# Patient Record
Sex: Male | Born: 1995 | Race: White | Hispanic: No | Marital: Married | Smoking: Never smoker
Health system: Southern US, Community
[De-identification: ages and names within clinical notes are randomized; demographics above are authoritative.]

---
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
## 2016-07-16 ENCOUNTER — Encounter (INDEPENDENT_AMBULATORY_CARE_PROVIDER_SITE_OTHER): Payer: Self-pay | Admitting: Orthopedic Surgery

## 2016-07-16 ENCOUNTER — Ambulatory Visit (INDEPENDENT_AMBULATORY_CARE_PROVIDER_SITE_OTHER): Payer: BLUE CROSS/BLUE SHIELD | Admitting: Orthopedic Surgery

## 2016-07-16 ENCOUNTER — Ambulatory Visit (INDEPENDENT_AMBULATORY_CARE_PROVIDER_SITE_OTHER): Payer: Self-pay

## 2016-07-16 VITALS — Ht 73.0 in | Wt 205.0 lb

## 2016-07-16 DIAGNOSIS — M79675 Pain in left toe(s): Secondary | ICD-10-CM

## 2016-07-16 NOTE — Progress Notes (Signed)
   Office Visit Note   Patient: Bob Moreno           Date of Birth: 06/20/1995           MRN: [id number] Visit Date: 07/16/2016              Requested by: No referring provider defined for this encounter. PCP: Patient, No Pcp Per  Chief Complaint  Patient presents with  . Left Foot - Pain    " turf toe"      HPI: Patient is a Bob [employer]no for [employer] he is 21 years old he states he's had no acute trauma but does catch in a squatting position with maximal dorsiflexion of the MTP joint and the IP joint of the left great toe. Patient has occasionally tried anti-inflammatories.  Assessment & Plan: Visit Diagnoses:  1. Pain of great toe, left     Plan: Recommended ibuprofen 600-800 mg 3 times a day as needed over the next several weeks. Patient was given a prescription for biotech for a carbon fiber plate for both shoes. He is to wear these for all activities of daily living Bob RuizJohn the day and will try to use these for playing his game. He states the cannot wear these while playing baseball he will not use them during baseball but otherwise use it at all times follow-up as needed.  Follow-Up Instructions: Return if symptoms worsen or fail to improve.   Ortho Exam  Patient is alert, oriented, no adenopathy, well-dressed, normal affect, normal respiratory effort. Examination patient has a normal gait. He has good pulses he has good ankle and subtalar motion. He has good motion of the MTP joint and the plantar plate is nontender to palpation no evidence of a turf toe. The sesamoids are nontender to palpation no evidence of a sesamoid stress fracture. He is point tender to palpation over the dorsum of the IP joint with dorsiflexion of the great toe. There is no redness no cellulitis.  Imaging: Xr Toe Great Left  Result Date: 07/16/2016 Three-view radiographs of the left foot some mild joint space narrowing of the MTP joint shows no proximal migration of the sesamoids there is no  evidence of a fracture of the IP joint and no joint space narrowing of the IP joint   Labs: No results found for: HGBA1C, ESRSEDRATE, CRP, LABURIC, REPTSTATUS, GRAMSTAIN, CULT, LABORGA  Orders:  Orders Placed This Encounter  Procedures  . XR Toe Great Left   No orders of the defined types were placed in this encounter.    Procedures: No procedures performed  Clinical Data: No additional findings.  ROS:  All other systems negative, except as noted in the HPI. Review of Systems  Objective: Vital Signs: Ht 6\' 1"  (1.854 m)   Wt 205 lb (93 kg)   BMI 27.05 kg/m   Specialty Comments:  No specialty comments available.  PMFS History: Patient Active Problem List   Diagnosis Date Noted  . Pain of great toe, left 07/16/2016   No past medical history on file.  No family history on file.  No past surgical history on file. Social History   Occupational History  . Not on file.   Social History Main Topics  . Smoking status: Never Smoker  . Smokeless tobacco: Never Used  . Alcohol use No  . Drug use: No  . Sexual activity: Not on file

## 2019-08-19 ENCOUNTER — Other Ambulatory Visit: Payer: Self-pay

## 2019-08-19 ENCOUNTER — Other Ambulatory Visit: Payer: Self-pay | Admitting: Nurse Practitioner

## 2019-08-19 ENCOUNTER — Ambulatory Visit
Admission: RE | Admit: 2019-08-19 | Discharge: 2019-08-19 | Disposition: A | Discharge status: Home / Self Care | Payer: No Typology Code available for payment source | Source: Ambulatory Visit | Attending: Nurse Practitioner | Admitting: Nurse Practitioner

## 2019-08-19 DIAGNOSIS — Z021 Encounter for pre-employment examination: Secondary | ICD-10-CM

## 2021-02-10 ENCOUNTER — Emergency Department (HOSPITAL_BASED_OUTPATIENT_CLINIC_OR_DEPARTMENT_OTHER): Payer: 59

## 2021-02-10 ENCOUNTER — Other Ambulatory Visit: Payer: Self-pay

## 2021-02-10 ENCOUNTER — Encounter (HOSPITAL_BASED_OUTPATIENT_CLINIC_OR_DEPARTMENT_OTHER): Payer: Self-pay | Admitting: *Deleted

## 2021-02-10 ENCOUNTER — Emergency Department (HOSPITAL_BASED_OUTPATIENT_CLINIC_OR_DEPARTMENT_OTHER)
Admission: EM | Admit: 2021-02-10 | Discharge: 2021-02-10 | Disposition: A | Discharge status: Home / Self Care | Payer: 59 | Attending: Emergency Medicine | Admitting: Emergency Medicine

## 2021-02-10 DIAGNOSIS — S01112A Laceration without foreign body of left eyelid and periocular area, initial encounter: Secondary | ICD-10-CM | POA: Diagnosis not present

## 2021-02-10 DIAGNOSIS — Y9321 Activity, ice skating: Secondary | ICD-10-CM | POA: Insufficient documentation

## 2021-02-10 DIAGNOSIS — S0101XA Laceration without foreign body of scalp, initial encounter: Secondary | ICD-10-CM

## 2021-02-10 DIAGNOSIS — S060X0A Concussion without loss of consciousness, initial encounter: Secondary | ICD-10-CM | POA: Insufficient documentation

## 2021-02-10 DIAGNOSIS — S0990XA Unspecified injury of head, initial encounter: Secondary | ICD-10-CM

## 2021-02-10 MED ORDER — LIDOCAINE HCL (PF) 1 % IJ SOLN
10.0000 mL | Freq: Once | INTRAMUSCULAR | Status: AC
  Administered 2021-02-10: 10 mL
  Filled 2021-02-10: qty 10

## 2021-02-10 MED ORDER — ACETAMINOPHEN 500 MG PO TABS
1000.0000 mg | ORAL_TABLET | Freq: Once | ORAL | Status: AC
  Administered 2021-02-10: 1000 mg via ORAL
  Filled 2021-02-10: qty 2

## 2021-02-10 NOTE — ED Notes (Signed)
Per family, pt c/o HA and was crying d/t pain; pt refused Tylenol when offered

## 2021-02-10 NOTE — ED Provider Notes (Signed)
Emergency Department Provider Note   I have reviewed the triage vital signs and the nursing notes.   HISTORY  Chief Complaint Head Injury   HPI Bob Moreno is a 25 y.o. male presents to the emergency department for evaluation after a fall.  Patient was ice-skating today with family and friends when he fell forward striking his head and face on the ice.  No no loss of consciousness but wife, at bedside, states that he was confused afterwards and has seemed intermittently agitated, which is unlike him.  He has been overall improving since arrival to the emergency department but she notes that he continues to occasionally be somewhat more agitated.  Patient denies any fall on outstretched hands.  No syncope.  No leg or back pain.  No unilateral numbness or weakness.   History reviewed. No pertinent past medical history.  Patient Active Problem List   Diagnosis Date Noted   Pain of great toe, left 07/16/2016    History reviewed. No pertinent surgical history.  Allergies Patient has no known allergies.  No family history on file.  Social History Social History   Tobacco Use   Smoking status: Never   Smokeless tobacco: Never  Vaping Use   Vaping Use: Never used  Substance Use Topics   Alcohol use: Yes    Comment: occasional   Drug use: No    Review of Systems  Constitutional: No fever/chills Eyes: No visual changes. ENT: No sore throat. Cardiovascular: Denies chest pain. Respiratory: Denies shortness of breath. Gastrointestinal: No abdominal pain.  No nausea, no vomiting.  No diarrhea.  No constipation. Genitourinary: Negative for dysuria. Musculoskeletal: Negative for back pain. No arm pain.  Skin: Right eyebrow laceration.  Neurological: Negative for focal weakness or numbness. Positive HA.   10-point ROS otherwise negative.  ____________________________________________   PHYSICAL EXAM:  VITAL SIGNS: ED Triage Vitals  Enc Vitals Group     BP  02/10/21 2106 (!) 137/96     Pulse Rate 02/10/21 2106 (!) 130     Resp 02/10/21 2106 20     Temp 02/10/21 2106 98.7 F (37.1 C)     Temp Source 02/10/21 2106 Oral     SpO2 02/10/21 2106 96 %     Weight --      Height 02/10/21 2107 6\' 2"  (1.88 m)   Constitutional: Alert and oriented. Well appearing and in no acute distress. Eyes: Conjunctivae are normal. PERRL. EOMI. Head: Hematoma to the left forehead with left lateral eyebrow laceration. Nose: No congestion/rhinnorhea. Mouth/Throat: Mucous membranes are moist.  Oropharynx non-erythematous. Neck: No stridor.  No cervical spine tenderness to palpation. Cardiovascular: Normal rate, regular rhythm. Good peripheral circulation. Grossly normal heart sounds.   Respiratory: Normal respiratory effort.  No retractions. Lungs CTAB. Gastrointestinal: No distention.  Musculoskeletal: No lower extremity tenderness nor edema. No gross deformities of extremities. Neurologic:  Normal speech and language. No gross focal neurologic deficits are appreciated.  Skin:  Skin is warm and dry. Scalp hematoma with 3 cm laceration to the lateral left eyebrow.   ____________________________________________  RADIOLOGY  CT HEAD WO CONTRAST ( )  Result Date: 02/10/2021 CLINICAL DATA:  Head trauma, moderate-severe. Fall ice skating. Hit head. EXAM: CT HEAD WITHOUT CONTRAST TECHNIQUE: Contiguous axial images were obtained from the base of the skull through the vertex without intravenous contrast. COMPARISON:  None. FINDINGS: Brain: No acute intracranial abnormality. Specifically, no hemorrhage, hydrocephalus, mass lesion, acute infarction, or significant intracranial injury. Vascular: No hyperdense vessel or unexpected  calcification. Skull: No acute calvarial abnormality. Sinuses/Orbits: No acute findings. Other: None IMPRESSION: No acute intracranial abnormality. Electronically Signed   By: Charlett Nose M.D.   On: 02/10/2021 22:15   CT Cervical Spine Wo  Contrast  Result Date: 02/10/2021 CLINICAL DATA:  Fall ice skating. Hit head. Neck trauma, midline tenderness (Age 49-64y) EXAM: CT CERVICAL SPINE WITHOUT CONTRAST TECHNIQUE: Multidetector CT imaging of the cervical spine was performed without intravenous contrast. Multiplanar CT image reconstructions were also generated. COMPARISON:  None. FINDINGS: Alignment: Normal. Skull base and vertebrae: No acute fracture. No primary bone lesion or focal pathologic process. Soft tissues and spinal canal: No prevertebral fluid or swelling. No visible canal hematoma. Disc levels:  Normal Upper chest: Negative Other: None IMPRESSION: Normal study. Electronically Signed   By: Charlett Nose M.D.   On: 02/10/2021 22:16   CT Maxillofacial Wo Contrast  Result Date: 02/10/2021 CLINICAL DATA:  Facial trauma, blunt.  Fall ice skating.  Hit head. EXAM: CT MAXILLOFACIAL WITHOUT CONTRAST TECHNIQUE: Multidetector CT imaging of the maxillofacial structures was performed. Multiplanar CT image reconstructions were also generated. COMPARISON:  None. FINDINGS: Osseous: No fracture or mandibular dislocation. No destructive process. Orbits: Negative. No traumatic or inflammatory finding. Sinuses: Postoperative changes in the left paranasal sinuses. Mucosal thickening throughout the left paranasal sinuses. No air-fluid levels. Soft tissues: Negative Limited intracranial: See head CT report IMPRESSION: No facial or orbital fracture. Electronically Signed   By: Charlett Nose M.D.   On: 02/10/2021 22:18    ____________________________________________   PROCEDURES  Procedure(s) performed:   Marland KitchenMarland KitchenLaceration Repair  Date/Time: 02/11/2021 1:14 PM Performed by: Maia Plan, MD Authorized by: Maia Plan, MD   Consent:    Consent obtained:  Verbal   Consent given by:  Patient   Risks, benefits, and alternatives were discussed: yes     Risks discussed:  Infection, need for additional repair, poor wound healing, poor cosmetic  result, pain and retained foreign body   Alternatives discussed:  No treatment Universal protocol:    Patient identity confirmed:  Verbally with patient Anesthesia:    Anesthesia method:  Local infiltration   Local anesthetic:  Lidocaine 1% w/o epi Laceration details:    Location:  Face   Face location:  L eyebrow   Length (cm):  3 Pre-procedure details:    Preparation:  Patient was prepped and draped in usual sterile fashion and imaging obtained to evaluate for foreign bodies Exploration:    Limited defect created (wound extended): no     Hemostasis achieved with:  Direct pressure   Imaging obtained comment:  CT head/face   Imaging outcome: foreign body not noted     Wound exploration: wound explored through full range of motion and entire depth of wound visualized     Wound extent: no foreign bodies/material noted, no muscle damage noted, no nerve damage noted, no underlying fracture noted and no vascular damage noted     Contaminated: no   Treatment:    Area cleansed with:  Povidone-iodine   Amount of cleaning:  Standard   Visualized foreign bodies/material removed: no   Skin repair:    Repair method:  Sutures   Suture size:  5-0   Suture material:  Prolene   Suture technique:  Simple interrupted   Number of sutures:  4 Approximation:    Approximation:  Close Repair type:    Repair type:  Simple Post-procedure details:    Dressing:  Open (no dressing)   Procedure completion:  Tolerated well, no immediate complications  ____________________________________________   INITIAL IMPRESSION / ASSESSMENT AND PLAN / ED COURSE  Pertinent labs & imaging results that were available during my care of the patient were reviewed by me and considered in my medical decision making (see chart for details).   Patient presents to the emergency department for evaluation after a fall while skating.  He has forehead hematoma and laceration to the lateral left eyebrow area that would likely  benefit from stitches.  No signs of entrapment.  He is somewhat agitated and emotionally labile here.  Suspect concussion clinically but will need CT imaging of the head along with face and cervical spine given his history of being somewhat altered on scene to rule out traumatic subarachnoid or subdural although lower suspicion for this clinically.  CT imaging reviewed and reassuring. Patient feeling well. Will refer to Neuro for concussion evaluation. Will return in 1 week for suture removal.  ____________________________________________  FINAL CLINICAL IMPRESSION(S) / ED DIAGNOSES  Final diagnoses:  Injury of head, initial encounter  Laceration of scalp, initial encounter  Concussion without loss of consciousness, initial encounter     MEDICATIONS GIVEN DURING THIS VISIT:  Medications  lidocaine (PF) (XYLOCAINE) 1 % injection 10 mL (10 mLs Infiltration Given by Other 02/10/21 2147)  acetaminophen (TYLENOL) tablet 1,000 mg (1,000 mg Oral Given 02/10/21 2223)    Note:  This document was prepared using Dragon voice recognition software and may include unintentional dictation errors.  Alona Bene, MD, [facility]    Jibri Schriefer, Arlyss Repress, MD 02/11/21 [phone]

## 2021-02-10 NOTE — ED Notes (Signed)
ED Provider at bedside. 

## 2021-02-10 NOTE — ED Notes (Signed)
Pts wife came out to desk stating that pts pupils "are not dilating equally". EDP notified.

## 2021-02-10 NOTE — ED Triage Notes (Addendum)
Per pt's wife at bedside, pt went ice skating tonight and fell when he stepped on the ice hitting his head first. States pt has been confused since the fall. Pt alert, oriented to self and place but confused to situation (when explained doctor would be in to evaluate him pt questioned why) and time (stated today was Thursday). Pt's wife states pt had 2 drinks over a 2 hour period. Lac present over left eye with swelling noted. PERRLA. Denies pain

## 2021-02-10 NOTE — ED Notes (Signed)
Pt became somewhat agitated when this RN assessing him; wife and brother were at bedside; wife sts this behavior is not typical for pt

## 2021-02-10 NOTE — Discharge Instructions (Signed)

## 2021-02-10 NOTE — ED Notes (Signed)
Pt refused vital sign recheck. Removed bp cuff prior to recheck. Heart rate improved. No signs of distress.

## 2021-02-20 ENCOUNTER — Ambulatory Visit (INDEPENDENT_AMBULATORY_CARE_PROVIDER_SITE_OTHER): Payer: 59 | Admitting: Neurology

## 2021-02-20 ENCOUNTER — Encounter: Payer: Self-pay | Admitting: Neurology

## 2021-02-20 ENCOUNTER — Other Ambulatory Visit: Payer: Self-pay

## 2021-02-20 ENCOUNTER — Telehealth: Payer: Self-pay | Admitting: Neurology

## 2021-02-20 VITALS — BP 143/94 | HR 88 | Ht 74.0 in | Wt 243.0 lb

## 2021-02-20 DIAGNOSIS — S060X0A Concussion without loss of consciousness, initial encounter: Secondary | ICD-10-CM | POA: Insufficient documentation

## 2021-02-20 NOTE — Telephone Encounter (Signed)
Pt called states he forgot to get a return to work note when he left. 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

## 2021-02-20 NOTE — Telephone Encounter (Signed)
No restrictions, patient will return to work on Friday 02-23-2021. Melvyn Novas, MD

## 2021-02-20 NOTE — Progress Notes (Signed)
SLEEP MEDICINE CLINIC    Provider:  Melvyn Novas, MD  Primary Care Physician:     Referring Provider: Maia Plan, Md [address] [place],  [place] [address]          Chief Complaint according to patient   Patient presents with:     New Patient (Initial Visit)           HISTORY OF PRESENT ILLNESS:  Bob Moreno is a 25 year -old Caucasian male patient seen here upon referral on 02/20/2021 from ED,  for a .  Chief concern according to patient : " I fell ice skating and hit my head- last Saturday , 02-10-2021, at 8.5 Pm".  He suffered a left eyebrow abrasion, and open wound needing stitches. He fell face first after tripping told his RN wife, he didn't vomit- but has anterograde amnesia. .  No insomnia, no double or blurred vision, headaches for 4 days super tired until Wednesday- feeling normal.No vertigo.     I have the pleasure of seeing Bob Moreno today, a right-handed Caucasian male with a possible concussion.  Social history:  Patient is working as a IT sales professional,  and lives in a household with spouse Bob Moreno, Bob Moreno) , no children.  Tobacco [id number] .  ETOH use rare , college.  Caffeine intake in form of Coffee( 1 cup) Hobbies : no regular sports.     Review of Systems: Out of a complete 14 system review, the patient complains of only the following symptoms, and all other reviewed systems are negative.:  No lasting systems.    Social History   Socioeconomic History   Marital status: Married    Spouse name: Bob Moreno   Number of children: Not on file   Years of education: Not on file   Highest education level: Bachelor's degree (e.g., BA, AB, BS)  Occupational History   Occupation: firefighter  Tobacco Use   Smoking status: Never   Smokeless tobacco: Never  Vaping Use   Vaping Use: Never used  Substance and Sexual Activity   Alcohol use: Yes    Comment: occasional   Drug use: No   Sexual activity: Not on file  Other Topics Concern   Not on file   Social History Narrative   Lives at home with wife   Right handed   Caffeine: a cup of coffee a day   Social Determinants of Health   Financial Resource Strain: Not on file  Food Insecurity: Not on file  Transportation Needs: Not on file  Physical Activity: Not on file  Stress: Not on file  Social Connections: Not on file    Family History  Problem Relation Age of Onset   Cancer - Colon Paternal Grandfather    Physical exam:  Today's Vitals   02/20/21 1011  BP: (!) 143/94  Pulse: 88  Weight: 243 lb (110.2 kg)  Height: 6\' 2"  (1.88 m)   Body mass index is 31.2 kg/m.   Wt Readings from Last 3 Encounters:  02/20/21 243 lb (110.2 kg)  07/16/16 205 lb (93 kg)     Ht Readings from Last 3 Encounters:  02/20/21 6\' 2"  (1.88 m)  02/10/21 6\' 2"  (1.88 m)  07/16/16 6\' 1"  (1.854 m)      General: The patient is awake, alert and appears not in acute distress. The patient is well groomed. Head: Normocephalic, atraumatic. Neck is supple. Mallampati 2,  Dental status: intact  Cardiovascular:  Regular rate and  cardiac rhythm by pulse,  without distended neck veins. Respiratory: Lungs are clear to auscultation.  Skin:  Without evidence of ankle edema, or rash. Trunk: The patient's posture is erect.   Neurologic exam : The patient is awake and alert, oriented to place and time.   Memory subjective described as intact.  Attention span & concentration ability appears normal.  Speech is fluent,  without  dysarthria, dysphonia or aphasia.  Mood and affect are appropriate.   Cranial nerves: no loss of smell or taste reported  Pupils are equal and briskly reactive to light. Funduscopic exam unremarkable. .  Extraocular movements in vertical and horizontal planes were intact and without nystagmus. No Diplopia. Visual fields by finger perimetry are intact. Hearing was intact to soft voice and finger rubbing.  Facial sensation intact to fine touch.  Facial motor strength is symmetric  and tongue and uvula move midline.  Neck ROM : rotation, tilt and flexion extension were normal for age and shoulder shrug was symmetrical.    Motor exam:  Symmetric bulk, tone and ROM.   Normal tone without cog wheeling, symmetric grip strength .   Sensory:  Fine touch, pinprick and vibration were tested  and  normal.  Proprioception tested in the upper extremities was normal.   Coordination: Rapid alternating movements in the fingers/hands were of normal speed.  The Finger-to-nose maneuver was intact without evidence of ataxia, dysmetria or tremor.   Gait and station: Patient could rise unassisted from a seated position, walked without assistive device.  Stance is of normal width/ base and the patient turned with 3 steps.  Toe and heel walk were deferred.  Deep tendon reflexes: in the  upper and lower extremities are symmetric and intact.  Babinski response was deferred .      CT and ED evaluation:  His head CT from 02/10/2021 was performed at 10:15 PM cervical spine was also evaluated by CT all this was normal skull base and vertebrae showed no fracture no dislocation no primary bone lesion soft tissue and spine and spinal canal showed no fluid accumulation or swelling of any kind.  No visible hematoma to.  Blunt trauma to the face.  Even his orbits are remarkably normal.  Maxillofacial facial CT showed no traumatic or inflammatory finding coincidentally there is mucosal thickening throughout the left paranasal sinuses noted.  This must have been there before the accident.  Sutures were removed without difficulties I do not think he needs an EEG I do not think he needs an MRI at this time I will release him to go back to work on Friday.  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   After spending a total time of  30  minutes face to face and additional time for physical and neurologic examination, review of laboratory studies,   personal review of imaging studies, reports and results of other testing and review of referral information / records as far as provided in visit, I have established the following assessments:  1) the patient undoubtedly suffered a concussion based on his initial symptoms which have all resolved.  He did remember going to the ice-skating ring of course but he had anterograde amnesia for the moment when he stepped on the ice and when he fell.  This is typical.  There is no reported loss of consciousness.   He appeared confused to his wife.  But he was not unresponsive.  My Plan is to proceed with:  1) No follow up needed.    I would like to thank Patient, No Pcp Per (Inactive) and Long, Arlyss Repress, Md [address] [place],  [place] [address] for allowing me to meet with and to take care of this pleasant patient.   In short, Bob Moreno is presenting with a concussion, resolved. ,Prn follow up.  CC: I will share my notes with PCP. Marland Kitchen  Electronically signed by: Melvyn Novas, MD 02/20/2021 10:17 AM  [facility] Board certified by The [employer] of Sleep Medicine and Diplomate of the [employer] of Sleep Medicine. Board certified In Neurology through the ABPN, Fellow of the [employer] of Neurology. Medical Director of [facility].

## 2021-02-20 NOTE — Patient Instructions (Signed)
Concussion, Adult ?A concussion is a brain injury from a hard, direct hit (trauma) to your head or body. This direct hit causes your brain to quickly shake back and forth inside your skull. A concussion may also be called a mild traumatic brain injury (TBI). Healing from this injury can take time. ?What are the causes? ?This condition is caused by: ?A direct hit to your head, such as: ?Running into a player during a game. ?Being hit in a fight. ?Hitting your head on a hard surface. ?A quick and sudden movement of the head or neck, such as in a car crash. ?What are the signs or symptoms? ?The signs of a concussion can be hard to notice. They may be missed by you, family members, and doctors. You may look fine on the outside but may not act or feel normal. ?Physical symptoms ?Headaches. ?Being dizzy. ?Problems with body balance. ?Being sensitive to light or noise. ?Vomiting or feeling like you may vomit. ?Being tired. ?Problems seeing or hearing. ?Not sleeping or eating as you used to. ?Seizure. ?Mental and emotional symptoms ?Feeling grouchy (irritable). ?Having mood changes. ?Problems remembering things. ?Trouble focusing your mind (concentrating), organizing, or making decisions. ?Being slow to think, act, react, speak, or read. ?Feeling worried or nervous (anxious). ?Feeling sad (depressed). ?How is this treated? ?This condition may be treated by: ?Stopping sports or activity if you are injured. If you hit your head or have signs of concussion: ?Do not return to sports or activities the same day. ?Get checked by a doctor before you return to your activities. ?Resting your body and your mind. ?Being watched carefully, often at home. ?Medicines to help with symptoms such as: ?Headaches. ?Feeling like you may vomit. ?Problems with sleep. ?Avoiding alcohol and drugs. ?Being asked to go to a concussion clinic or a place to help you recover (rehabilitation center). ?Recovery from a concussion can take time. Return to  activities only: ?When you are fully healed. ?When your doctor says it is safe. ?Avoid taking strong pain medicines (opioids) for a concussion. ?Follow these instructions at home: ?Activity ?Limit activities that need a lot of thought or focus, such as: ?Homework or work for your job. ?Watching TV. ?Using the computer or phone. ?Playing memory games and puzzles. ?Rest. Rest helps your brain heal. Make sure you: ?Get plenty of sleep. Most adults should get 7-9 hours of sleep each night. ?Rest during the day. Take naps or breaks when you feel tired. ?Avoid activity like exercise until your doctor says its safe. Stop any activity that makes symptoms worse. ?Do not do activities that could cause a second concussion, such as riding a bike or playing sports. ?Ask your doctor when you can return to your normal activities, such as school, work, sports, and driving. Your ability to react may be slower. Do not do these activities if you are dizzy. ?General instructions ? ?Take over-the-counter and prescription medicines only as told by your doctor. ?Do not drink alcohol until your doctor says you can. ?Watch your symptoms and tell other people to do the same. Other problems can occur after a concussion. Older adults have a higher risk of serious problems. ?Tell your work manager, teachers, school nurse, school counselor, coach, or athletic trainer about your injury and symptoms. Tell them about what you can or cannot do. ?Keep all follow-up visits as told by your doctor. This is important. ?How is this prevented? ?It is very important that you do not get another brain injury. In rare   cases, another injury can cause brain damage that will not go away, brain swelling, or death. The risk of this is greatest in the first 7-10 days after a head injury. To avoid injuries: ?Stop activities that could lead to a second concussion, such as contact sports, until your doctor says it is okay. ?When you return to sports or activities: ?Do  not crash into other players. This is how most concussions happen. ?Follow the rules. ?Respect other players. Do not engage in violent behavior while playing. ?Get regular exercise. Do strength and balance training. ?Wear a helmet that fits you well during sports, biking, or other activities. ?Helmets can help protect you from serious skull and brain injuries, but they do not protect you from a concussion. Even when wearing a helmet, you should avoid being hit in the head. ?Contact a doctor if: ?Your symptoms do not get better. ?You have new symptoms. ?You have another injury. ?Get help right away if: ?You have bad headaches or your headaches get worse. ?You feel weak or numb in any part of your body. ?You feel mixed up (confused). ?Your balance gets worse. ?You vomit often. ?You feel more sleepy than normal. ?You cannot speak well, or have slurred speech. ?You have a seizure. ?Others have trouble waking you up. ?You have changes in how you act. ?You have changes in how you see (vision). ?You pass out (lose consciousness). ?These symptoms may be an emergency. Do not wait to see if the symptoms will go away. Get medical help right away. Call your local emergency services (911 in the [place] Do not drive yourself to the hospital. ?Summary ?A concussion is a brain injury from a hard, direct hit (trauma) to your head or body. ?This condition is treated with rest and careful watching of symptoms. ?Ask your doctor when you can return to your normal activities, such as school, work, or driving. ?Get help right away if you have a very bad headache, feel weak in any part of your body, have a seizure, have changes in how you act or see, or if you are mixed up or more sleepy than normal. ?This information is not intended to replace advice given to you by your health care provider. Make sure you discuss any questions you have with your health care provider. ?Document Revised: 05/04/2020 Document Reviewed: 05/04/2020 ?Elsevier  Patient Education ? 2022 [employer] ? ?

## 2021-02-20 NOTE — Telephone Encounter (Signed)
Dr. Vickey Huger- you saw pt today. Ok to provide return to work letter? Any restrictions?

## 2021-02-21 ENCOUNTER — Encounter: Payer: Self-pay | Admitting: *Deleted

## 2021-02-21 NOTE — Telephone Encounter (Signed)
LVM for pt letting him know letter ready for pick up. Provided office hours for this week. Asked him to call back if any other questions/concerns.

## 2022-12-06 ENCOUNTER — Other Ambulatory Visit: Payer: Self-pay

## 2022-12-06 ENCOUNTER — Encounter (HOSPITAL_BASED_OUTPATIENT_CLINIC_OR_DEPARTMENT_OTHER): Payer: Self-pay

## 2022-12-06 ENCOUNTER — Emergency Department (HOSPITAL_BASED_OUTPATIENT_CLINIC_OR_DEPARTMENT_OTHER): Payer: No Typology Code available for payment source

## 2022-12-06 ENCOUNTER — Emergency Department (HOSPITAL_BASED_OUTPATIENT_CLINIC_OR_DEPARTMENT_OTHER)
Admission: EM | Admit: 2022-12-06 | Discharge: 2022-12-06 | Disposition: A | Discharge status: Home / Self Care | Payer: Worker's Compensation | Attending: Emergency Medicine | Admitting: Emergency Medicine

## 2022-12-06 DIAGNOSIS — S82831A Other fracture of upper and lower end of right fibula, initial encounter for closed fracture: Secondary | ICD-10-CM | POA: Insufficient documentation

## 2022-12-06 DIAGNOSIS — S99911A Unspecified injury of right ankle, initial encounter: Secondary | ICD-10-CM | POA: Diagnosis present

## 2022-12-06 DIAGNOSIS — X501XXA Overexertion from prolonged static or awkward postures, initial encounter: Secondary | ICD-10-CM | POA: Diagnosis not present

## 2022-12-06 DIAGNOSIS — Y990 Civilian activity done for income or pay: Secondary | ICD-10-CM | POA: Insufficient documentation

## 2022-12-06 MED ORDER — CELECOXIB 200 MG PO CAPS: 200.0000 mg | ORAL_CAPSULE | Freq: Two times a day (BID) | ORAL | 0 refills | Status: AC

## 2022-12-06 NOTE — Discharge Instructions (Signed)
1) you must remain nonweightbearing on the ankle until you are cleared by orthopedics. Please call first thing Monday morning to schedule an appointment at [facility].  (Dr. Marlene Bast office)) Contact a health care provider if: You have pain or swelling that gets worse. Your pain or swelling does not get better with rest or medicine. Your cast gets damaged. Get help right away if: You develop new pain or swelling. Your skin or toes below the injured ankle: Turn blue or gray. Feel cold. Lose feeling. Have less feeling than normal when something touches them.

## 2022-12-06 NOTE — ED Provider Notes (Signed)
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  Patient reports that he hit a slick spot on the pavement and his right foot rolled backward and he inverted the ankle and felt pain and a snap on the distal part of his leg.  He is unable to bear weight.  He was brought in for further evaluation.  He denies numbness or tingling.   Ankle Pain      Home Medications Prior to Admission medications   Not on File      Allergies    Patient has no known allergies.    Review of Systems   Review of Systems  Physical Exam Updated Vital Signs BP (!) 156/96   Pulse 77   Temp 98.5 F (36.9 C) (Oral)   Resp 16   SpO2 99%  Physical Exam Vitals and nursing note reviewed.  Constitutional:      General: He is not in acute distress.    Appearance: He is well-developed. He is not diaphoretic.  HENT:     Head: Normocephalic and atraumatic.  Eyes:     General: No scleral icterus.    Conjunctiva/sclera: Conjunctivae normal.  Cardiovascular:     Rate and Rhythm: Normal rate and regular rhythm.     Heart sounds: Normal heart sounds.  Pulmonary:     Effort: Pulmonary effort is normal. No respiratory distress.     Breath sounds: Normal breath sounds.  Abdominal:     Palpations: Abdomen is soft.     Tenderness: There is no abdominal tenderness.  Musculoskeletal:     Cervical back: Normal range of motion and neck supple.     Comments: Normal sensation and capillary refill, DP and PT pulse 2+.  Range of motion limited due to pain in the ankle.  Significant tenderness and swelling noted over the lateral malleolus.  No calf tenderness.  Skin:    General: Skin is warm and dry.   Neurological:     Mental Status: He is alert.  Psychiatric:        Behavior: Behavior normal.     ED Results / Procedures / Treatments   Labs (all labs ordered are listed, but only abnormal results are displayed) Labs Reviewed - No data to display  EKG None  Radiology No results found.  Procedures .Splint Application  Date/Time: 12/07/2022 12:44 AM  Performed by: Arthor Captain, PA-C Authorized by: Arthor Captain, PA-C   Consent:    Consent obtained:  Verbal   Consent given by:  Patient   Risks discussed:  Discoloration, numbness and swelling   Alternatives discussed:  No treatment Universal protocol:    Patient identity confirmed:  Arm band Pre-procedure details:    Distal neurologic exam:  Normal   Distal perfusion: distal pulses strong   Procedure details:    Location:  Ankle   Ankle location:  L ankle   Splint type:  Short leg   Supplies:  Fiberglass, elastic bandage and cotton padding Post-procedure details:    Distal neurologic exam:  Unchanged   Distal perfusion: distal pulses strong     Procedure completion:  Tolerated well, no immediate complications  Medications Ordered in ED Medications - No data to display  ED Course/ Medical Decision Making/ A&P Clinical Course as of 12/06/22 1910  Fri Dec 06, 2022  4098 Case discussed with Dr. Ophelia Charter. Recommends NWB, short leg splint and office follow up . [AH]    Clinical Course User Index [AH] Arthor Captain, PA-C                                 Medical Decision Making 27 year old male with mechanical injury to the left ankle.  Patient has an oblique fracture of the distal fibula and an acute transverse fracture of the distal physeal region of the fibula.  Case discussed as above with Dr. Ophelia Charter.  Patient placed in short leg splint.  He is to remain nonweightbearing on crutches until he follows up with orthopedics in the outpatient setting.  Noted to have elevated blood pressure here in the setting of  trauma.  He is advised to follow-up with that in the outpatient setting.  Pain well-controlled.  Discussed outpatient follow-up and return precautions.  Amount and/or Complexity of Data Reviewed Radiology: ordered.    Details: I visualized and interpreted a right ankle x-ray which shows a distal fibular fracture and 2 sites.  Ankle mortise appears to be well aligned.  No obvious displacement of the joint.  Risk Prescription drug management.   27 year old male with right ankle injury. Patient will need outpatient follow-up with orthopedics.  Will give referral to Dr. Ophelia Charter for follow-up.  Discussed return precautions.        Final Clinical Impression(s) / ED Diagnoses Final diagnoses:  None    Rx / DC Orders ED Discharge Orders     None         Arthor Captain, PA-C 12/07/22 0045    Royanne Foots, DO 12/17/22 [phone]

## 2022-12-06 NOTE — ED Triage Notes (Signed)
Pt c/o R ankle injury after playing pickleball. +ROM, +Cap refill  Ibuprofen PTA, approx ago

## 2022-12-06 NOTE — ED Notes (Signed)
Reviewed AVS with patient, patient expressed understanding of directions, denies further questions at this time. 

## 2023-01-25 IMAGING — CT CT HEAD W/O CM
3 series · 16 of 47 positions shown, 19 images · non-contrast
Comparison: None.

CLINICAL DATA: Head trauma, moderate-severe. Fall ice skating. Hit
head.

EXAM:
CT HEAD WITHOUT CONTRAST
TECHNIQUE: Contiguous axial images were obtained from the base of the skull
through the vertex without intravenous contrast.

[Series 3: head 5.0 h30s · axial · 0.49mm/px · z∈[-204,-49]mm · 10 of 37 slices shown, 13 images]
[im 3/37  brain]
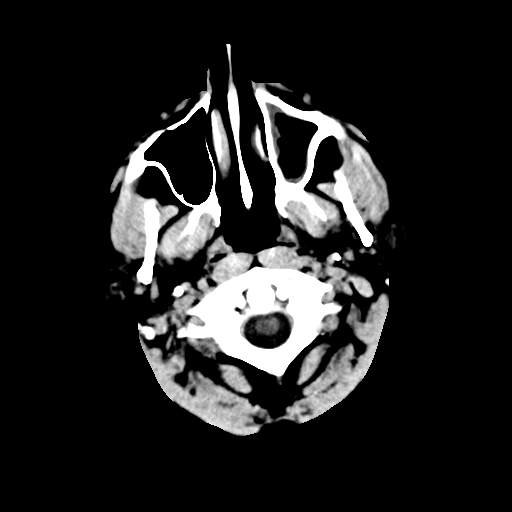
[im 3/37  bone]
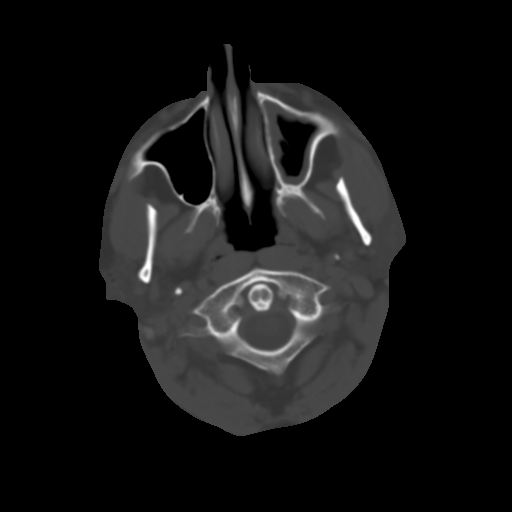
[im 7/37  brain]
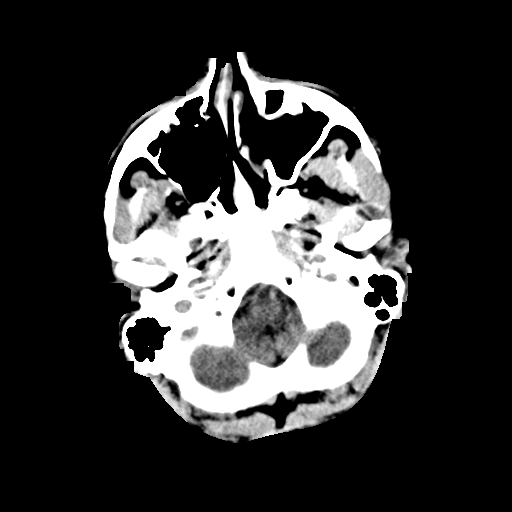
[im 10/37  brain]
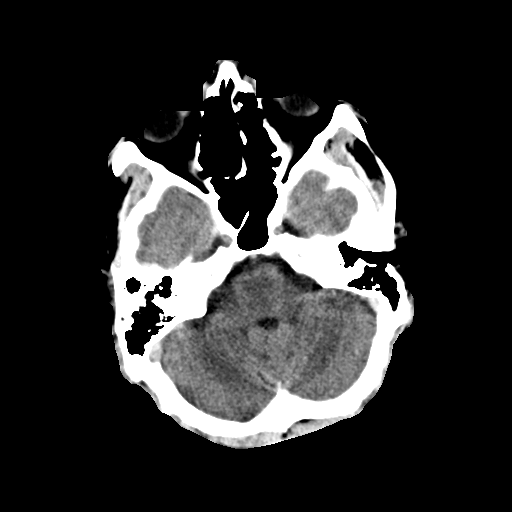
[im 13/37  brain]
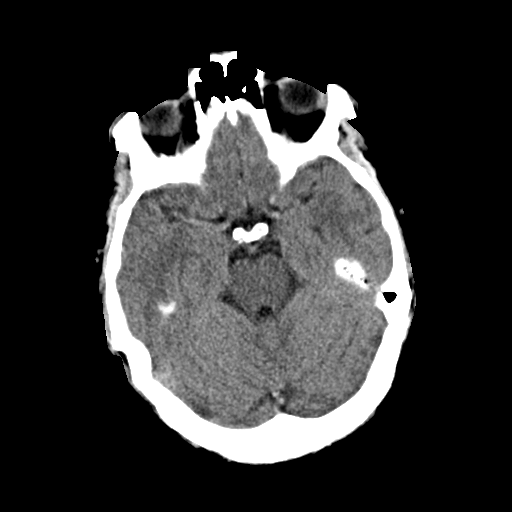
[im 17/37  brain]
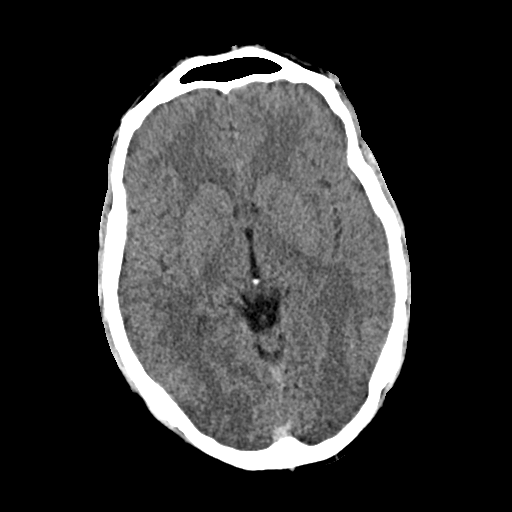
[im 17/37  bone]
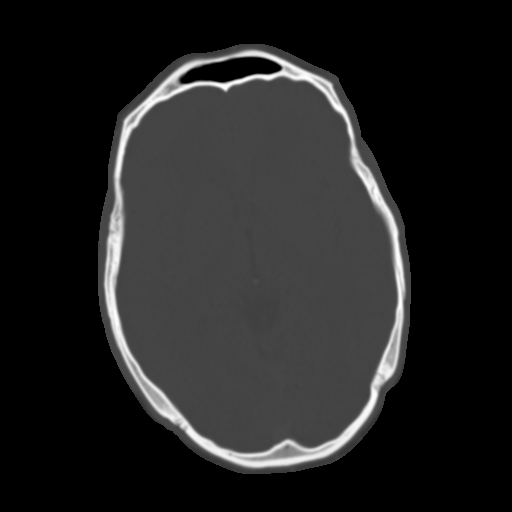
[im 20/37  brain]
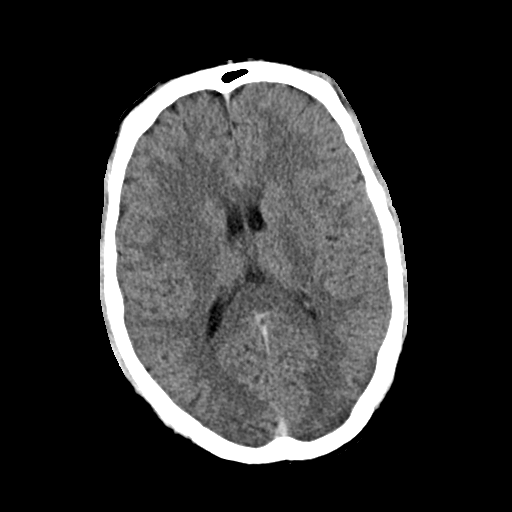
[im 24/37  brain]
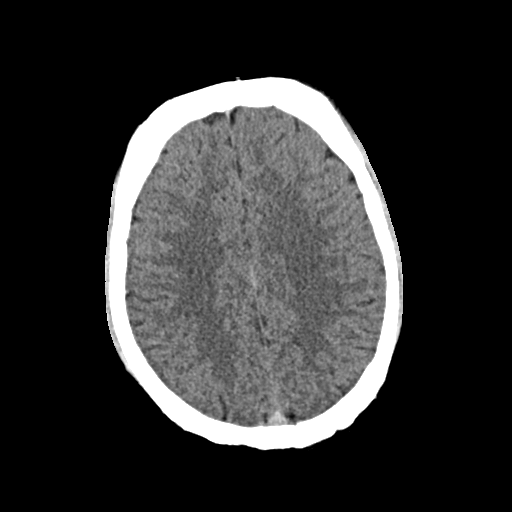
[im 28/37  brain]
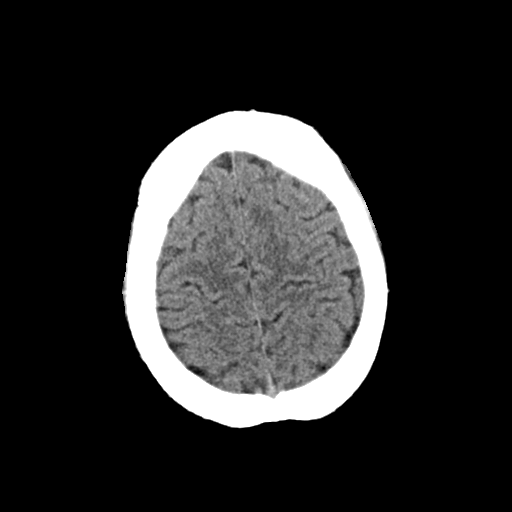
[im 30/37  brain]
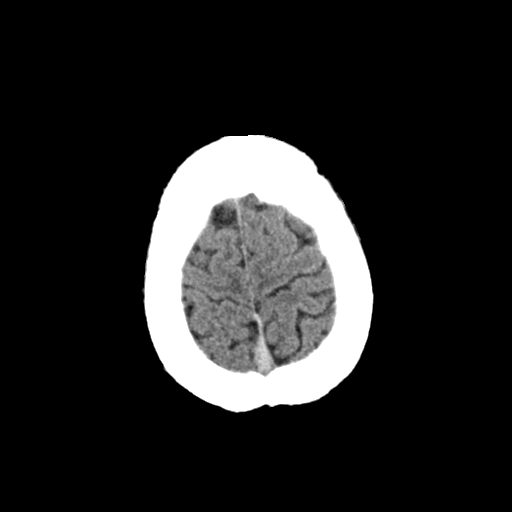
[im 30/37  bone]
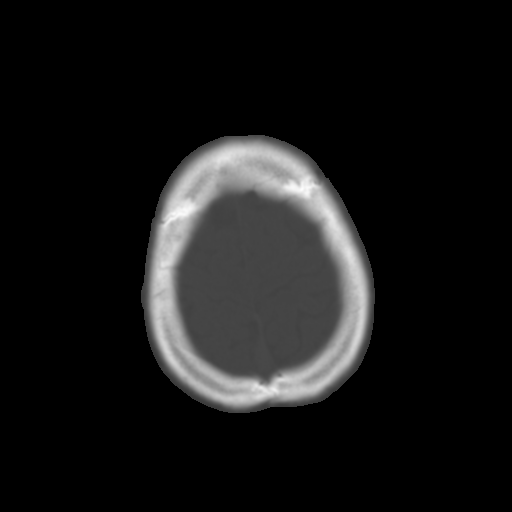
[im 34/37  brain]
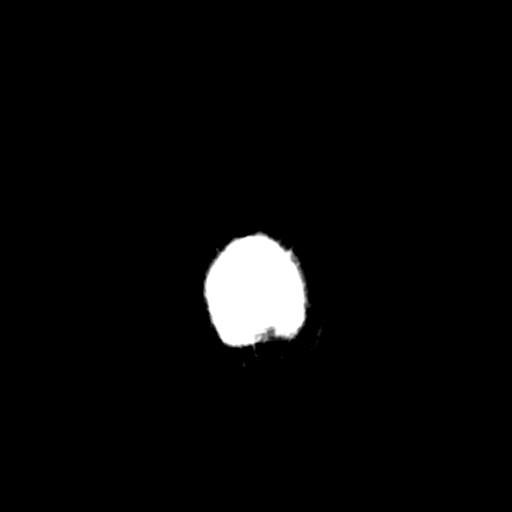

[Series 5: head 3.0 mpr cor · coronal · 0.36mm/px · 3 of 80 slices shown]
[im 27/80  brain]
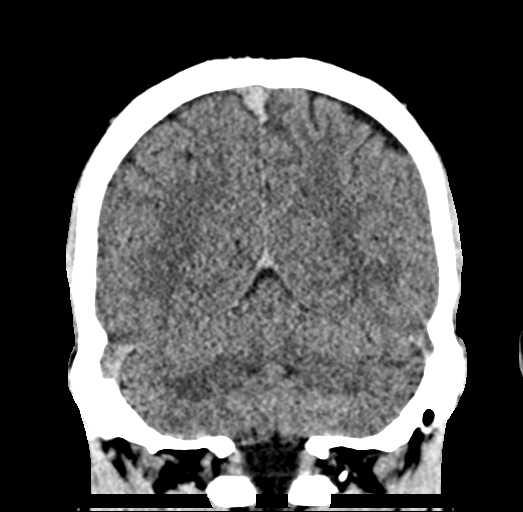
[im 36/80  brain]
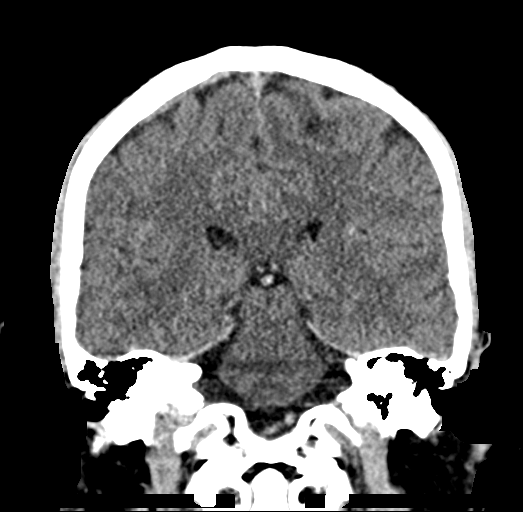
[im 44/80  brain]
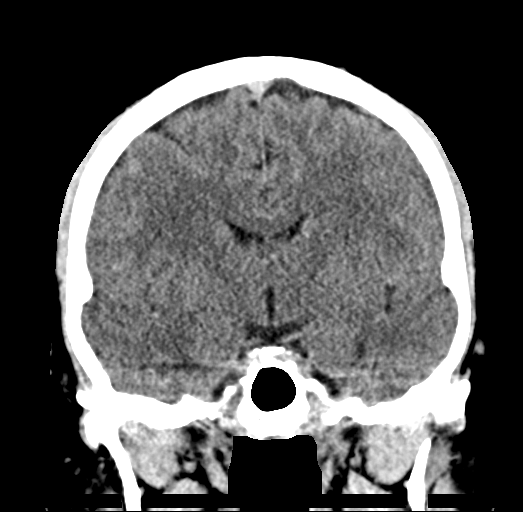

[Series 6: head 3.0 mpr sag · sagittal · 0.36mm/px · 3 of 67 slices shown]
[im 23/67  brain]
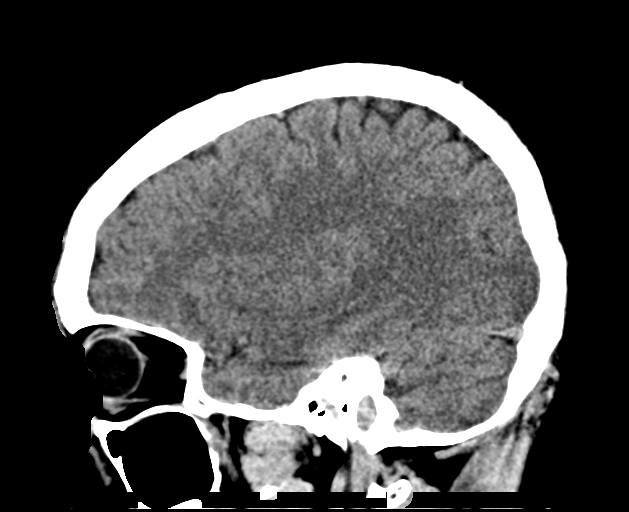
[im 34/67  brain]
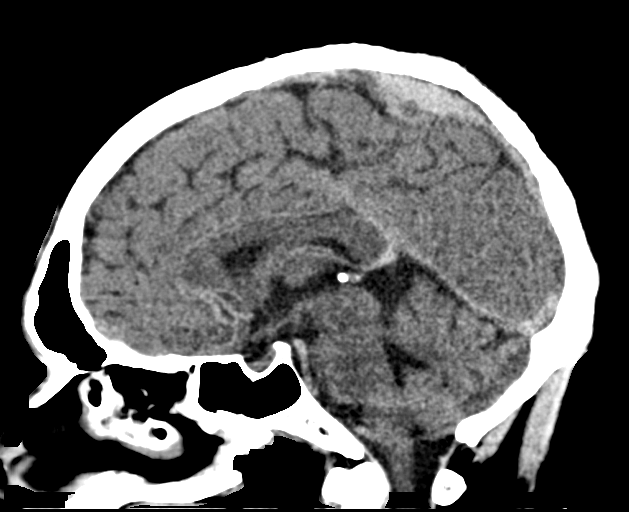
[im 45/67  brain]
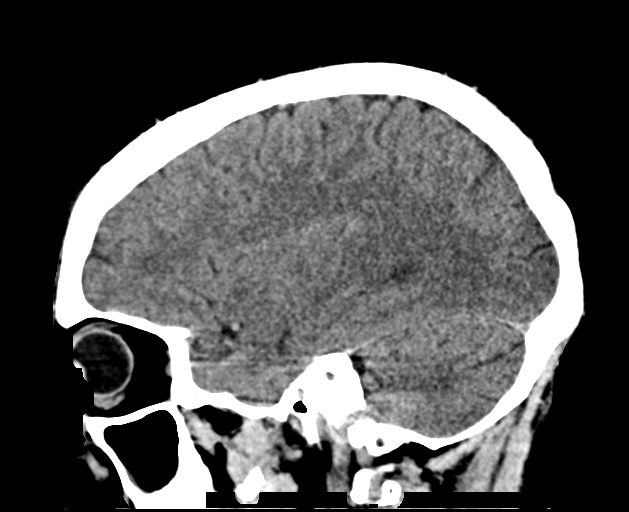

[16 of 47 positions shown; findings below may reference images not displayed]

FINDINGS: Brain: No acute intracranial abnormality. Specifically, no
hemorrhage, hydrocephalus, mass lesion, acute infarction, or
significant intracranial injury.

Vascular: No hyperdense vessel or unexpected calcification.

Skull: No acute calvarial abnormality.

Sinuses/Orbits: No acute findings.

Other: None
IMPRESSION: No acute intracranial abnormality.

## 2023-01-25 IMAGING — CT CT CERVICAL SPINE W/O CM
3 of 4 series · 12 of 33 positions shown, 14 images · non-contrast
Comparison: None.

CLINICAL DATA: Fall ice skating. Hit head. Neck trauma, midline
tenderness (Age 16-64y)

EXAM:
CT CERVICAL SPINE WITHOUT CONTRAST
TECHNIQUE: Multidetector CT imaging of the cervical spine was performed without
intravenous contrast. Multiplanar CT image reconstructions were also
generated.

[Series 5: c_spine 2.0 i30s 3 · axial · 0.42mm/px · z∈[-333,-173]mm · 4 of 121 slices shown, 5 images]
[im 21/121  soft-tissue]
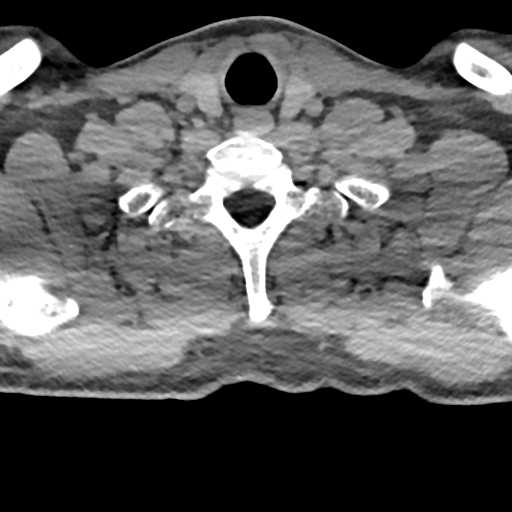
[im 21/121  bone]
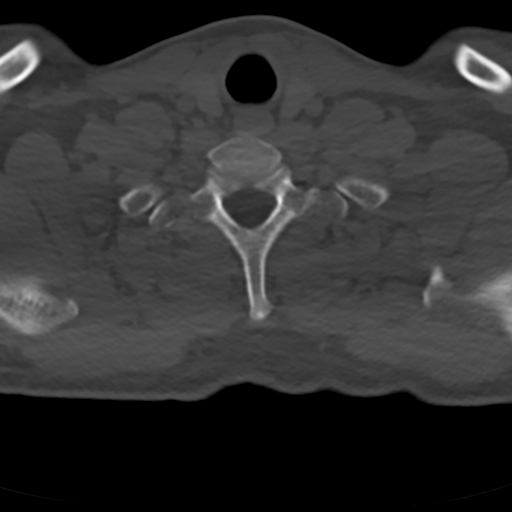
[im 41/121  bone]
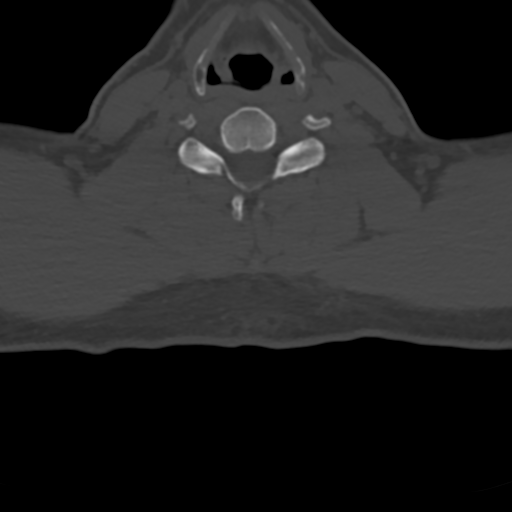
[im 81/121  bone]
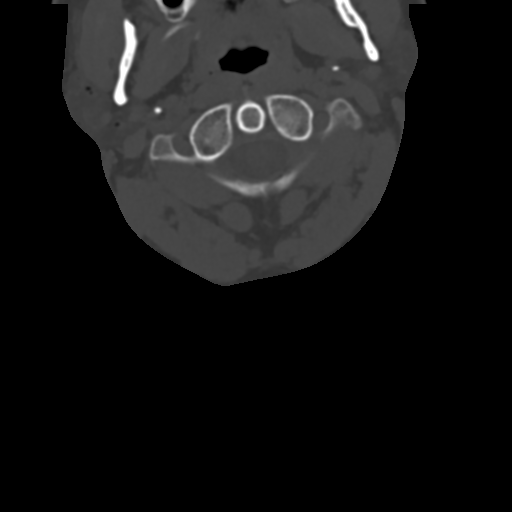
[im 101/121  bone]
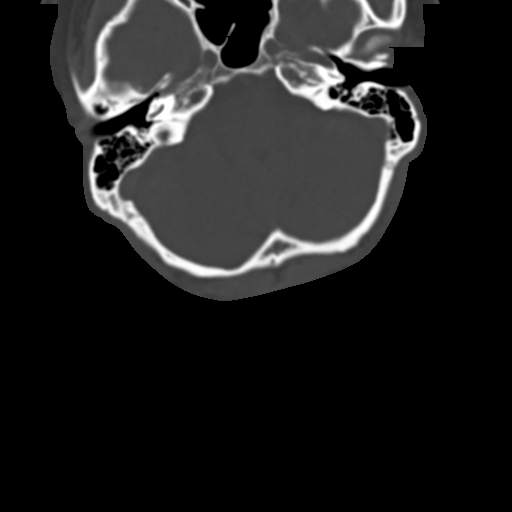

[Series 6: coronals · coronal · 0.28mm/px · 3 of 64 slices shown]
[im 13/64  bone]
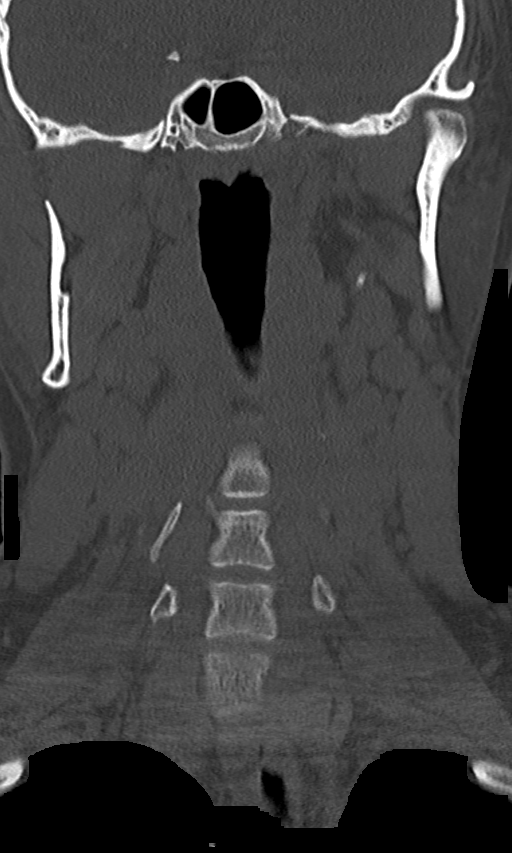
[im 26/64  bone]
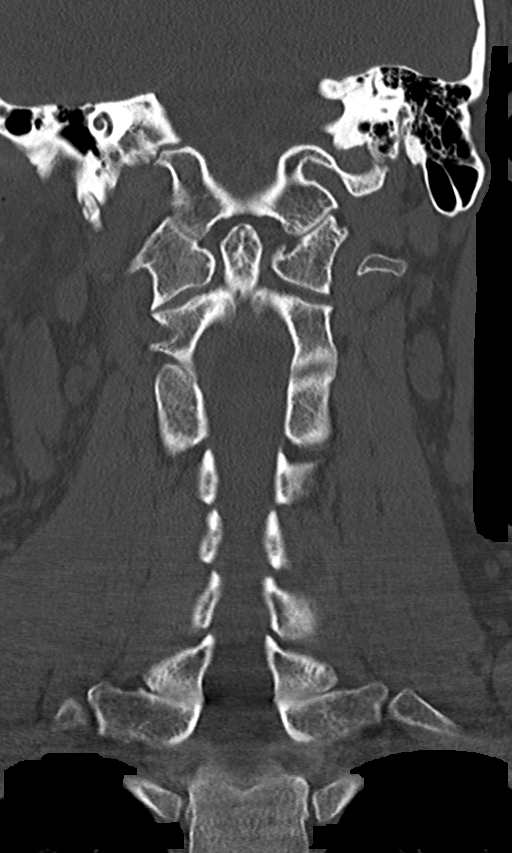
[im 38/64  bone]
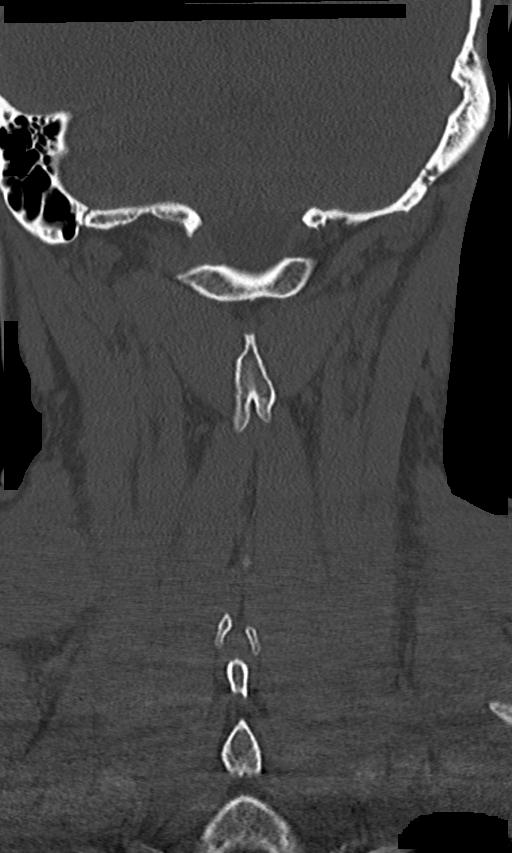

[Series 7: sagittals · sagittal · 0.30mm/px · 5 of 61 slices shown, 6 images]
[im 21/61  bone]
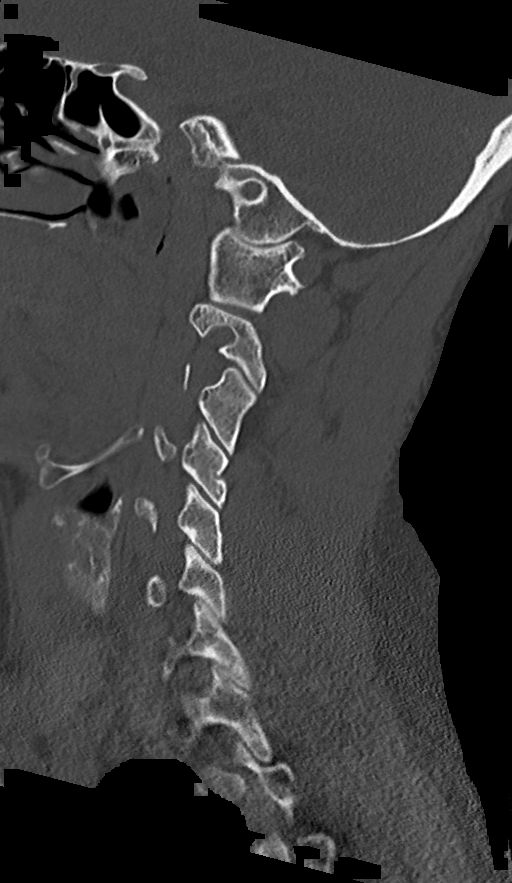
[im 26/61  bone]
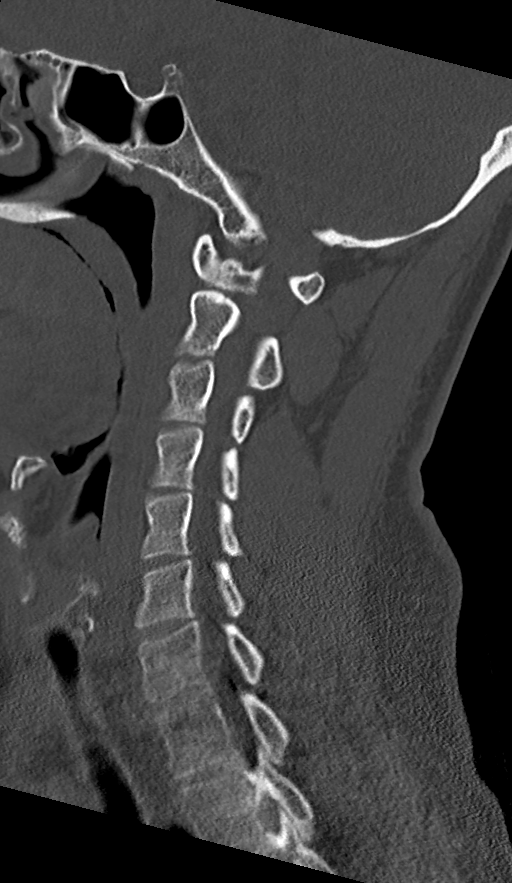
[im 31/61  soft-tissue]
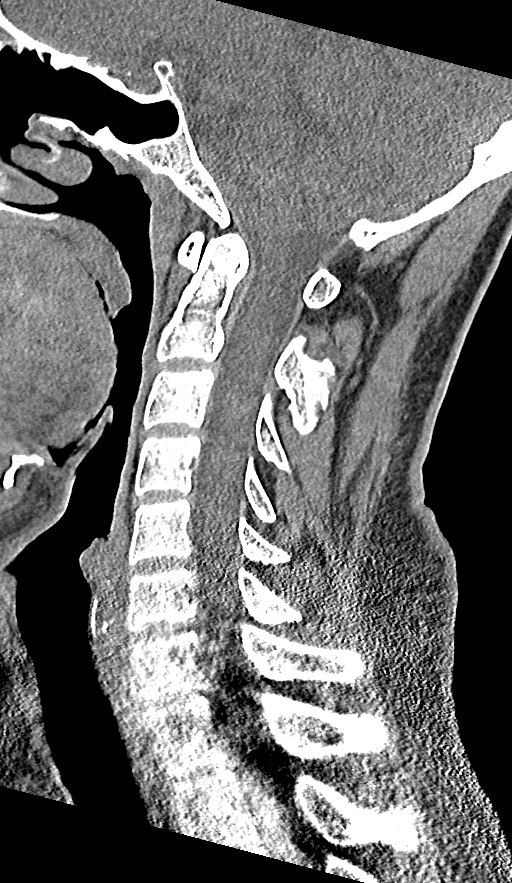
[im 31/61  bone]
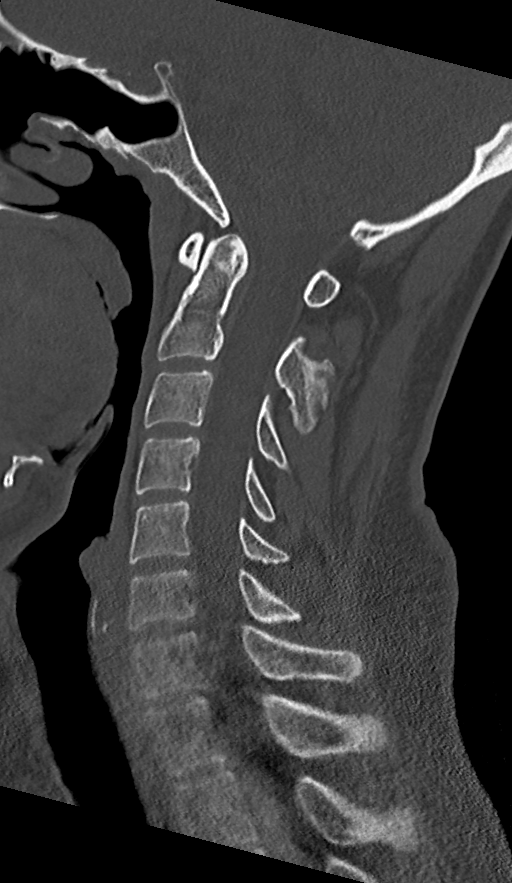
[im 36/61  bone]
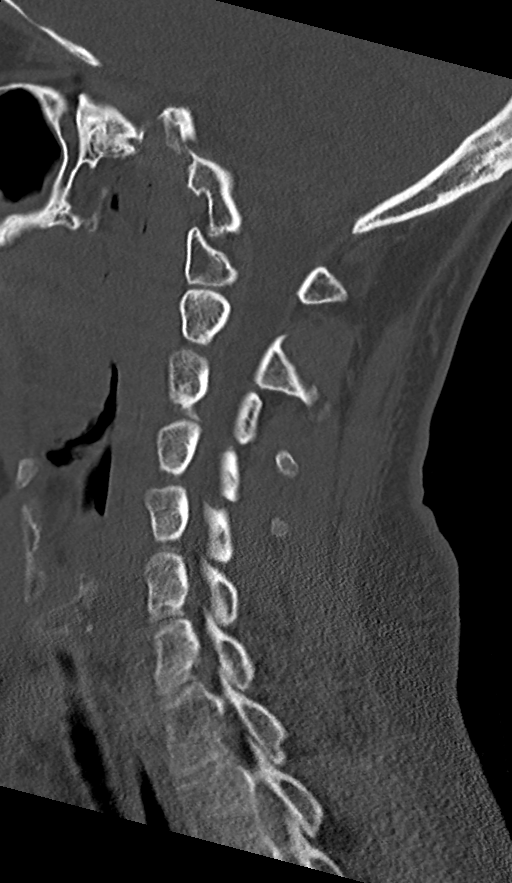
[im 41/61  bone]
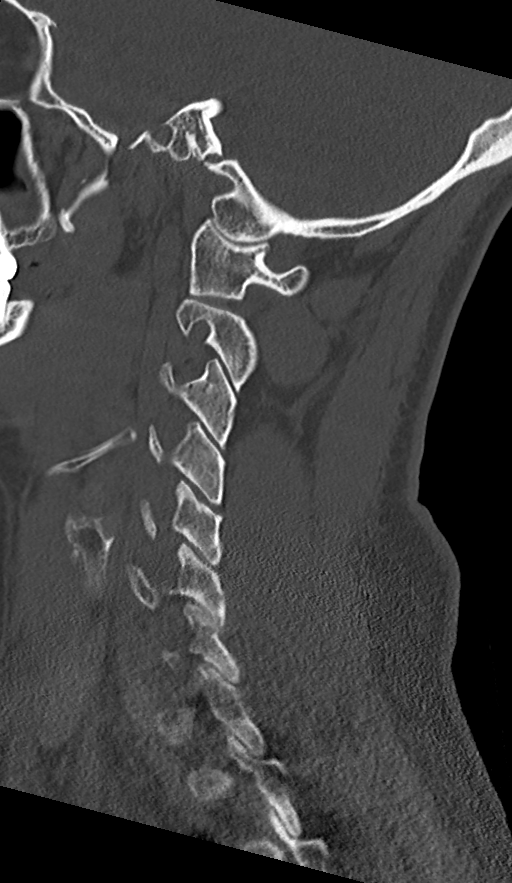

[12 of 33 positions shown; findings below may reference images not displayed]

FINDINGS: Alignment: Normal.

Skull base and vertebrae: No acute fracture. No primary bone lesion
or focal pathologic process.

Soft tissues and spinal canal: No prevertebral fluid or swelling. No
visible canal hematoma.

Disc levels:  Normal

Upper chest: Negative

Other: None
IMPRESSION: Normal study.
